# Patient Record
Sex: Male | Born: 1970 | Race: Black or African American | Hispanic: No | Marital: Married | Smoking: Current every day smoker
Health system: Southern US, Community
[De-identification: ages and names within clinical notes are randomized; demographics above are authoritative.]

## PROBLEM LIST (undated history)

## (undated) DIAGNOSIS — J45909 Unspecified asthma, uncomplicated: Secondary | ICD-10-CM

---

## 2005-02-09 ENCOUNTER — Emergency Department: Payer: Self-pay | Admitting: Unknown Physician Specialty

## 2005-08-17 ENCOUNTER — Other Ambulatory Visit: Payer: Self-pay

## 2005-08-17 ENCOUNTER — Emergency Department: Payer: Self-pay | Admitting: Emergency Medicine

## 2005-08-19 ENCOUNTER — Ambulatory Visit: Payer: Self-pay | Admitting: Emergency Medicine

## 2005-08-21 ENCOUNTER — Emergency Department: Payer: Self-pay | Admitting: Emergency Medicine

## 2005-08-25 ENCOUNTER — Ambulatory Visit: Payer: Self-pay | Admitting: Emergency Medicine

## 2006-09-12 ENCOUNTER — Emergency Department: Payer: Self-pay | Admitting: Emergency Medicine

## 2008-08-16 ENCOUNTER — Emergency Department: Payer: Self-pay | Admitting: Emergency Medicine

## 2008-08-17 ENCOUNTER — Emergency Department: Payer: Self-pay | Admitting: Emergency Medicine

## 2008-08-21 ENCOUNTER — Emergency Department: Payer: Self-pay | Admitting: Emergency Medicine

## 2008-09-11 ENCOUNTER — Ambulatory Visit: Payer: Self-pay | Admitting: Physician Assistant

## 2008-09-17 ENCOUNTER — Ambulatory Visit: Payer: Self-pay | Admitting: Unknown Physician Specialty

## 2011-03-21 ENCOUNTER — Emergency Department: Payer: Self-pay | Admitting: Emergency Medicine

## 2011-09-14 ENCOUNTER — Emergency Department: Payer: Self-pay | Admitting: Emergency Medicine

## 2015-07-09 ENCOUNTER — Emergency Department
Admission: EM | Admit: 2015-07-09 | Discharge: 2015-07-09 | Disposition: A | Discharge status: Home / Self Care | Payer: Worker's Compensation | Attending: Emergency Medicine | Admitting: Emergency Medicine

## 2015-07-09 ENCOUNTER — Emergency Department: Payer: Worker's Compensation

## 2015-07-09 DIAGNOSIS — S61215A Laceration without foreign body of left ring finger without damage to nail, initial encounter: Secondary | ICD-10-CM | POA: Diagnosis not present

## 2015-07-09 DIAGNOSIS — J45909 Unspecified asthma, uncomplicated: Secondary | ICD-10-CM | POA: Diagnosis not present

## 2015-07-09 DIAGNOSIS — Y92008 Other place in unspecified non-institutional (private) residence as the place of occurrence of the external cause: Secondary | ICD-10-CM | POA: Insufficient documentation

## 2015-07-09 DIAGNOSIS — S6982XA Other specified injuries of left wrist, hand and finger(s), initial encounter: Secondary | ICD-10-CM | POA: Diagnosis present

## 2015-07-09 DIAGNOSIS — Y9389 Activity, other specified: Secondary | ICD-10-CM | POA: Diagnosis not present

## 2015-07-09 DIAGNOSIS — F17200 Nicotine dependence, unspecified, uncomplicated: Secondary | ICD-10-CM | POA: Diagnosis not present

## 2015-07-09 DIAGNOSIS — S63285A Dislocation of proximal interphalangeal joint of left ring finger, initial encounter: Secondary | ICD-10-CM | POA: Diagnosis not present

## 2015-07-09 DIAGNOSIS — Y999 Unspecified external cause status: Secondary | ICD-10-CM | POA: Diagnosis not present

## 2015-07-09 DIAGNOSIS — W228XXA Striking against or struck by other objects, initial encounter: Secondary | ICD-10-CM | POA: Insufficient documentation

## 2015-07-09 DIAGNOSIS — S63255A Unspecified dislocation of left ring finger, initial encounter: Secondary | ICD-10-CM

## 2015-07-09 HISTORY — DX: Unspecified asthma, uncomplicated: J45.909

## 2015-07-09 MED ORDER — IBUPROFEN 600 MG PO TABS: 600.0000 mg | ORAL_TABLET | Freq: Four times a day (QID) | ORAL | Status: AC | PRN

## 2015-07-09 MED ORDER — HYDROCODONE-ACETAMINOPHEN 5-325 MG PO TABS: 1.0000 | ORAL_TABLET | Freq: Four times a day (QID) | ORAL | Status: AC | PRN

## 2015-07-09 MED ORDER — LIDOCAINE HCL (PF) 1 % IJ SOLN
5.0000 mL | Freq: Once | INTRAMUSCULAR | Status: DC
  Filled 2015-07-09: qty 5

## 2015-07-09 NOTE — Discharge Instructions (Signed)
Finger Dislocation Finger dislocation is the displacement of bones in your finger at the joints. Most commonly, finger dislocation occurs at the proximal interphalangeal joint (the joint closest to your knuckle). Very strong, fibrous tissues (ligaments) and joint capsules connect the three bones of your fingers.  CAUSES Dislocation is caused by a forceful impact. This impact moves these bones off the joint and often tears your ligaments.  SYMPTOMS Symptoms of finger dislocation include:  Deformity of your finger.  Pain, with loss of movement. DIAGNOSIS  Finger dislocation is diagnosed with a physical exam. Often, X-ray exams are done to see if you have associated injuries, such as bone fractures. TREATMENT  Finger dislocations are treated by putting your bones back into position (reduction) either by manually moving the bones back into place or through surgery. Your finger is then kept in a fixed position (immobilized) with the use of a dressing or splint for a brief period. When your ligament has to be surgically repaired, it needs to be kept in a fixed position with a dressing or splint for 1 to 2 weeks. Because joint stiffness is a long-term complication of finger dislocation, hand exercises or physical therapy to increase the range of motion and to regain strength is usually started as soon as the ligament is healed. Exercises and therapy generally last no more than 3 months. HOME CARE INSTRUCTIONS The following measures can help to reduce pain and speed up the healing process:  Rest your injured joint. Do not move until instructed otherwise by your caregiver. Avoid activities similar to the one that caused your injury.  Apply ice to your injured joint for the first day or 2 after your reduction or as directed by your caregiver. Applying ice helps to reduce inflammation and pain.  Put ice in a plastic bag.  Place a towel between your skin and the bag.  Leave the ice on for 15-20 minutes  at a time, every 2 hours while you are awake.  Elevate your hand above your heart as directed by your caregiver to reduce swelling.  Take over-the-counter or prescription medicine for pain as your caregiver instructs you. SEEK IMMEDIATE MEDICAL CARE IF:  Your dressing or splint becomes damaged.  Your pain becomes worse rather than better.  You lose feeling in your finger, or it becomes cold and white. MAKE SURE YOU:  Understand these instructions.  Will watch your condition.  Will get help right away if you are not doing well or get worse.   This information is not intended to replace advice given to you by your health care provider. Make sure you discuss any questions you have with your health care provider.   Document Released: 03/13/2000 Document Revised: 04/06/2014 Document Reviewed: 08/10/2014 Elsevier Interactive Patient Education 2016 [employer]  
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
## 2015-08-02 ENCOUNTER — Encounter: Payer: Self-pay | Admitting: *Deleted

## 2015-08-02 ENCOUNTER — Emergency Department: Payer: Self-pay

## 2015-08-02 ENCOUNTER — Emergency Department
Admission: EM | Admit: 2015-08-02 | Discharge: 2015-08-02 | Disposition: A | Discharge status: Home / Self Care | Payer: Self-pay | Attending: Emergency Medicine | Admitting: Emergency Medicine

## 2015-08-02 DIAGNOSIS — Z87891 Personal history of nicotine dependence: Secondary | ICD-10-CM | POA: Insufficient documentation

## 2015-08-02 DIAGNOSIS — J40 Bronchitis, not specified as acute or chronic: Secondary | ICD-10-CM

## 2015-08-02 DIAGNOSIS — Z91013 Allergy to seafood: Secondary | ICD-10-CM | POA: Insufficient documentation

## 2015-08-02 DIAGNOSIS — R0600 Dyspnea, unspecified: Secondary | ICD-10-CM

## 2015-08-02 DIAGNOSIS — Z79899 Other long term (current) drug therapy: Secondary | ICD-10-CM | POA: Insufficient documentation

## 2015-08-02 DIAGNOSIS — J209 Acute bronchitis, unspecified: Secondary | ICD-10-CM | POA: Insufficient documentation

## 2015-08-02 DIAGNOSIS — J45909 Unspecified asthma, uncomplicated: Secondary | ICD-10-CM

## 2015-08-02 LAB — COMPREHENSIVE METABOLIC PANEL
ALBUMIN: 4.5 g/dL (ref 3.5–5.0)
ALK PHOS: 48 U/L (ref 38–126)
ALT: 20 U/L (ref 17–63)
AST: 34 U/L (ref 15–41)
Anion gap: 10 (ref 5–15)
BILIRUBIN TOTAL: 0.5 mg/dL (ref 0.3–1.2)
BUN: 13 mg/dL (ref 6–20)
CALCIUM: 9.4 mg/dL (ref 8.9–10.3)
CO2: 24 mmol/L (ref 22–32)
Chloride: 105 mmol/L (ref 101–111)
Creatinine, Ser: 1 mg/dL (ref 0.61–1.24)
GFR calc Af Amer: >60 mL/min (ref >60)
GFR calc non Af Amer: >60 mL/min (ref >60)
GLUCOSE: 170 mg/dL — AB (ref 65–99)
POTASSIUM: 3.5 mmol/L (ref 3.5–5.1)
Sodium: 139 mmol/L (ref 135–145)
TOTAL PROTEIN: 8.1 g/dL (ref 6.5–8.1)

## 2015-08-02 LAB — CBC
HEMATOCRIT: 46.6 % (ref 40.0–52.0)
Hemoglobin: 15.8 g/dL (ref 13.0–18.0)
MCH: 30.8 pg (ref 26.0–34.0)
MCHC: 33.9 g/dL (ref 32.0–36.0)
MCV: 91 fL (ref 80.0–100.0)
Platelets: 230 10*3/uL (ref 150–440)
RBC: 5.12 MIL/uL (ref 4.40–5.90)
RDW: 12.6 % (ref 11.5–14.5)
WBC: 8.8 10*3/uL (ref 3.8–10.6)

## 2015-08-02 LAB — TROPONIN I: Troponin I: <0.03 ng/mL (ref <0.031)

## 2015-08-02 MED ORDER — IPRATROPIUM-ALBUTEROL 0.5-2.5 (3) MG/3ML IN SOLN
3.0000 mL | Freq: Once | RESPIRATORY_TRACT | Status: AC
  Administered 2015-08-02: 3 mL via RESPIRATORY_TRACT

## 2015-08-02 MED ORDER — BENZONATATE 100 MG PO CAPS
ORAL_CAPSULE | ORAL | Status: AC
  Filled 2015-08-02: qty 1

## 2015-08-02 MED ORDER — BENZONATATE 100 MG PO CAPS
100.0000 mg | ORAL_CAPSULE | Freq: Once | ORAL | Status: AC
  Administered 2015-08-02: 100 mg via ORAL
  Filled 2015-08-02: qty 1

## 2015-08-02 MED ORDER — IPRATROPIUM-ALBUTEROL 0.5-2.5 (3) MG/3ML IN SOLN
RESPIRATORY_TRACT | Status: AC
  Filled 2015-08-02: qty 3

## 2015-08-02 MED ORDER — PREDNISONE 20 MG PO TABS
60.0000 mg | ORAL_TABLET | Freq: Once | ORAL | Status: AC
  Administered 2015-08-02: 60 mg via ORAL
  Filled 2015-08-02: qty 3

## 2015-08-02 MED ORDER — AZITHROMYCIN 250 MG PO TABS: 250.0000 mg | ORAL_TABLET | Freq: Every day | ORAL | Status: AC

## 2015-08-02 MED ORDER — AZITHROMYCIN 500 MG PO TABS
500.0000 mg | ORAL_TABLET | Freq: Once | ORAL | Status: AC
  Administered 2015-08-02: 500 mg via ORAL
  Filled 2015-08-02: qty 1

## 2015-08-02 MED ORDER — ALBUTEROL SULFATE HFA 108 (90 BASE) MCG/ACT IN AERS: 2.0000 | INHALATION_SPRAY | Freq: Four times a day (QID) | RESPIRATORY_TRACT | Status: AC | PRN

## 2015-08-02 MED ORDER — PREDNISONE 20 MG PO TABS: 40.0000 mg | ORAL_TABLET | Freq: Every day | ORAL | Status: DC

## 2015-08-02 MED ORDER — IPRATROPIUM-ALBUTEROL 0.5-2.5 (3) MG/3ML IN SOLN
3.0000 mL | Freq: Once | RESPIRATORY_TRACT | Status: AC
  Administered 2015-08-02: 3 mL via RESPIRATORY_TRACT
  Filled 2015-08-02: qty 3

## 2015-08-02 NOTE — ED Provider Notes (Signed)
[facility] Emergency Department Provider Note  Time seen: 8:36 PM  I have reviewed the triage vital signs and the nursing notes.   HISTORY  Chief Complaint Shortness of Breath and Wheezing    HPI Jeffery MeigsHoward C Chrisp Jr. is a 45 y.o. male with a past medical history of asthma during childhood who presents the emergency department with shortness of breath and wheeze. According to the patient for the past 3 days he has been feeling short of breath with a cough and sputum production area denies any chest pain. States he was diagnosed with asthma as a child but has not had issues with it during adulthood. Does have a 6-7 years smoking history but quit 4 months ago. Denies any COPD diagnoses. Overall the patient appears well, he has received 2 breathing treatments in triage, and during my examination and history patient states he already feels much much better.Describes the shortness of breath is moderate when he was at home.     Past Medical History  Diagnosis Date  . Asthma     There are no active problems to display for this patient.   No past surgical history on file.  Current Outpatient Rx  Name  Route  Sig  Dispense  Refill  . HYDROcodone-acetaminophen (NORCO) 5-325 MG tablet   Oral   Take 1 tablet by mouth every 6 (six) hours as needed for severe pain.   6 tablet   0   . ibuprofen (ADVIL,MOTRIN) 600 MG tablet   Oral   Take 1 tablet (600 mg total) by mouth every 6 (six) hours as needed.   30 tablet   0     Allergies Shellfish allergy  No family history on file.  Social History Social History  Substance Use Topics  . Smoking status: Former Games developermoker  . Smokeless tobacco: None  . Alcohol Use: Yes    Review of Systems Constitutional: Negative for fever. Cardiovascular: Negative for chest pain. Respiratory:Moderate shortness of breath, much improved after breathing treatments. Gastrointestinal: Negative for abdominal pain Musculoskeletal:  Negative for back pain. Neurological: Negative for headache 10-point ROS otherwise negative.  ____________________________________________   PHYSICAL EXAM:  VITAL SIGNS: ED Triage Vitals  Enc Vitals Group     BP 08/02/15 1934 152/79 mmHg     Pulse Rate 08/02/15 1934 113     Resp 08/02/15 1934 22     Temp 08/02/15 1934 98.3 F (36.8 C)     Temp Source 08/02/15 1934 Oral     SpO2 08/02/15 1934 95 %     Weight 08/02/15 1934 260 lb (117.935 kg)     Height 08/02/15 1934 6\' 1"  (1.854 m)     Head Cir --      Peak Flow --      Pain Score 08/02/15 2030 0     Pain Loc --      Pain Edu? --      Excl. in GC? --     Constitutional: Alert and oriented. Well appearing and in no distress. Eyes: Normal exam ENT   Head: Normocephalic and atraumatic.   Mouth/Throat: Mucous membranes are moist. Cardiovascular: Normal rate, regular rhythm. No murmur Respiratory: Normal respiratory rate around 20 breaths per minute minute. No retractions. Mild to moderate expiratory wheezes bilaterally. Gastrointestinal: Soft and nontender. No distention.   Musculoskeletal: Nontender with normal range of motion in all extremities.  Neurologic:  Normal speech and language. No gross focal neurologic deficits Skin:  Skin is warm, dry and  intact.  Psychiatric: Mood and affect are normal. Speech and behavior are normal.  ____________________________________________    EKG  EKG reviewed and interpreted by myself shows sinus tachycardia 106 bpm, narrow QRS, normal axis, normal intervals, nonspecific but no concerning ST changes.  ____________________________________________    RADIOLOGY  Chest x-ray shows no acute abnormality   INITIAL IMPRESSION / ASSESSMENT AND PLAN / ED COURSE  Pertinent labs & imaging results that were available during my care of the patient were reviewed by me and considered in my medical decision making (see chart for details).  Patient presents the emergency department  with shortness of breath and 3 days of cough/congestion. Patient does have mild to moderate expiratory wheezes bilaterally. We'll dose of additional breathing treatment. Patient's chest x-ray is clear, currently awaiting lab results. Suspect the patient likely has an underlying reactive airway disease versus mild COPD which has been exacerbated by an upper respiratory infection/bronchitis. We'll cover the patient with a Z-Pak, started on prednisone, and dose an additional DuoNeb. Patient agreeable to plan.  Patient's labs are largely within normal limits. Patient is feeling much better. We'll discharge the patient home with primary care follow-up. We will cover with Zithromax, prednisone, and an albuterol inhaler. Patient agreeable to plan.  ____________________________________________   FINAL CLINICAL IMPRESSION(S) / ED DIAGNOSES  Dyspnea Reactive airway disease Bronchitis   [name], MD 08/02/15 2152

## 2015-08-02 NOTE — ED Notes (Signed)
Pt to triage via wheelchair.  Pt reports wheezing, tightness in chest for 3 days.  No n/v/d.  Sx worse tonight.  Hx of childhood asthma  Pt on no inhalers.   Pt wheezing in triage.

## 2015-08-02 NOTE — ED Notes (Signed)
Breathing treatment given in triage.  

## 2015-08-02 NOTE — Discharge Instructions (Signed)

## 2017-02-21 ENCOUNTER — Other Ambulatory Visit: Payer: Self-pay

## 2017-02-21 ENCOUNTER — Emergency Department: Payer: BLUE CROSS/BLUE SHIELD

## 2017-02-21 ENCOUNTER — Emergency Department
Admission: EM | Admit: 2017-02-21 | Discharge: 2017-02-21 | Disposition: A | Discharge status: Home / Self Care | Payer: BLUE CROSS/BLUE SHIELD | Attending: Emergency Medicine | Admitting: Emergency Medicine

## 2017-02-21 DIAGNOSIS — J40 Bronchitis, not specified as acute or chronic: Secondary | ICD-10-CM

## 2017-02-21 DIAGNOSIS — Z87891 Personal history of nicotine dependence: Secondary | ICD-10-CM | POA: Insufficient documentation

## 2017-02-21 DIAGNOSIS — R0602 Shortness of breath: Secondary | ICD-10-CM | POA: Diagnosis present

## 2017-02-21 LAB — CBC
HEMATOCRIT: 45.3 % (ref 40.0–52.0)
Hemoglobin: 15.6 g/dL (ref 13.0–18.0)
MCH: 31.5 pg (ref 26.0–34.0)
MCHC: 34.4 g/dL (ref 32.0–36.0)
MCV: 91.5 fL (ref 80.0–100.0)
Platelets: 216 10*3/uL (ref 150–440)
RBC: 4.95 MIL/uL (ref 4.40–5.90)
RDW: 12.7 % (ref 11.5–14.5)
WBC: 5.1 10*3/uL (ref 3.8–10.6)

## 2017-02-21 LAB — BASIC METABOLIC PANEL
Anion gap: 10 (ref 5–15)
BUN: 11 mg/dL (ref 6–20)
CHLORIDE: 105 mmol/L (ref 101–111)
CO2: 23 mmol/L (ref 22–32)
Calcium: 8.8 mg/dL — ABNORMAL LOW (ref 8.9–10.3)
Creatinine, Ser: 1.2 mg/dL (ref 0.61–1.24)
GFR calc non Af Amer: >60 mL/min (ref >60)
Glucose, Bld: 122 mg/dL — ABNORMAL HIGH (ref 65–99)
POTASSIUM: 3.6 mmol/L (ref 3.5–5.1)
SODIUM: 138 mmol/L (ref 135–145)

## 2017-02-21 LAB — TROPONIN I: Troponin I: <0.03 ng/mL (ref <0.03)

## 2017-02-21 MED ORDER — ALBUTEROL SULFATE HFA 108 (90 BASE) MCG/ACT IN AERS: 2.0000 | INHALATION_SPRAY | Freq: Four times a day (QID) | RESPIRATORY_TRACT | 2 refills | Status: AC | PRN

## 2017-02-21 MED ORDER — PREDNISONE 20 MG PO TABS
60.0000 mg | ORAL_TABLET | Freq: Once | ORAL | Status: AC
  Administered 2017-02-21: 60 mg via ORAL
  Filled 2017-02-21: qty 3

## 2017-02-21 MED ORDER — PREDNISONE 10 MG (21) PO TBPK: ORAL_TABLET | Freq: Every day | ORAL | 0 refills | Status: DC

## 2017-02-21 MED ORDER — IPRATROPIUM-ALBUTEROL 0.5-2.5 (3) MG/3ML IN SOLN
3.0000 mL | Freq: Once | RESPIRATORY_TRACT | Status: AC
  Administered 2017-02-21: 3 mL via RESPIRATORY_TRACT
  Filled 2017-02-21: qty 3

## 2017-02-21 MED ORDER — PREDNISONE 20 MG PO TABS
ORAL_TABLET | ORAL | Status: AC
  Filled 2017-02-21: qty 1

## 2017-02-21 NOTE — ED Triage Notes (Signed)
Pt came to ED via pov c/o sob starting yesterday. Productive cough. Denies fevers or chest pain. VS stable.

## 2017-02-21 NOTE — ED Notes (Signed)
Pt is ambulatory with wife to POV. VSS. Skin WNL. Respirations non-labored and equal. No questions or concerns during discharge instructions.

## 2017-02-21 NOTE — ED Provider Notes (Signed)
[facility] Emergency Department Provider Note       Time seen: ----------------------------------------- 7:39 PM on 02/21/2017 -----------------------------------------   I have reviewed the triage vital signs and the nursing notes.  HISTORY   Chief Complaint Shortness of Breath    HPI Jeffery MeigsHoward C Chrisp Jr. is a 46 y.o. male with no known past medical history who presents to the ED for shortness of breath starting yesterday.  He describes a productive cough but denies any fevers or chest pain.  Shortness of breath is worse with exertion.  Patient does admit to smoking and states he likely gets respiratory infections twice a year.  Last year he received breathing treatments due to shortness of breath associated with a respiratory infection.  He has not had any respiratory infection since then.  Past Medical History:  Diagnosis Date  . Asthma     There are no active problems to display for this patient.   History reviewed. No pertinent surgical history.  Allergies Shellfish allergy  Social History Social History   Tobacco Use  . Smoking status: Former Smoker  Substance Use Topics  . Alcohol use: Yes  . Drug use: Not on file    Review of Systems Constitutional: Negative for fever. Eyes: Negative for vision changes ENT:  Negative for congestion, sore throat Cardiovascular: Negative for chest pain. Respiratory: Positive for cough and shortness of breath Gastrointestinal: Negative for abdominal pain, vomiting and diarrhea. Genitourinary: Negative for dysuria. Musculoskeletal: Negative for back pain. Skin: Negative for rash. Neurological: Negative for headaches, focal weakness or numbness.  All systems negative/normal/unremarkable except as stated in the HPI  ____________________________________________   PHYSICAL EXAM:  VITAL SIGNS: ED Triage Vitals [02/21/17 1854]  Enc Vitals Group     BP (!) 149/84     Pulse Rate 83     Resp 16      Temp 98.6 F (37 C)     Temp Source Oral     SpO2 98 %     Weight 235 lb (106.6 kg)     Height 6\' 1"  (1.854 m)     Head Circumference      Peak Flow      Pain Score      Pain Loc      Pain Edu?      Excl. in GC?     Constitutional: Alert and oriented. Well appearing and in no distress. Eyes: Conjunctivae are normal. Normal extraocular movements. ENT   Head: Normocephalic and atraumatic.   Nose: No congestion/rhinnorhea.   Mouth/Throat: Mucous membranes are moist.   Neck: No stridor. Cardiovascular: Normal rate, regular rhythm. No murmurs, rubs, or gallops. Respiratory: Normal respiratory effort without tachypnea nor retractions. Breath sounds are clear and equal bilaterally. No wheezes/rales/rhonchi. Gastrointestinal: Soft and nontender. Normal bowel sounds Musculoskeletal: Nontender with normal range of motion in extremities. No lower extremity tenderness nor edema. Neurologic:  Normal speech and language. No gross focal neurologic deficits are appreciated.  Skin:  Skin is warm, dry and intact. No rash noted. Psychiatric: Mood and affect are normal. Speech and behavior are normal.  ____________________________________________  EKG: Interpreted by me.  Sinus rhythm with a rate of 70 bpm, normal PR interval, normal QRS, normal QT.  ____________________________________________  ED COURSE:  Pertinent labs & imaging results that were available during my care of the patient were reviewed by me and considered in my medical decision making (see chart for details). Patient presents for shortness of breath and cough, we will assess  with labs and imaging as indicated.   Procedures ____________________________________________   LABS (pertinent positives/negatives)  Labs Reviewed  BASIC METABOLIC PANEL - Abnormal; Notable for the following components:      Result Value   Glucose, Bld 122 (*)    Calcium 8.8 (*)    All other components within normal limits  CBC   TROPONIN I    RADIOLOGY Images were viewed by me  Chest x-ray IMPRESSION: No active cardiopulmonary disease. ____________________________________________  DIFFERENTIAL DIAGNOSIS   Pneumonia, URI, bronchitis, COPD, asthma, pneumothorax, CHF  FINAL ASSESSMENT AND PLAN  Acute bronchitis   Plan: Patient had presented for shortness of breath. Patient's labs are reassuring. Patient's imaging did not reveal any pneumonia.  This is likely bronchitis and he does not warrant antibiotics at this time but would benefit from inhalers and steroids.  He is stable for outpatient follow-up.   Emily FilbertWilliams, Jonathan E, MD   Note: This note was generated in part or whole with voice recognition software. Voice recognition is usually quite accurate but there are transcription errors that can and very often do occur. I apologize for any typographical errors that were not detected and corrected.     Emily FilbertWilliams, Jonathan E, MD 02/21/17 2037

## 2017-06-28 ENCOUNTER — Other Ambulatory Visit: Payer: Self-pay

## 2017-06-28 ENCOUNTER — Encounter: Payer: Self-pay | Admitting: Emergency Medicine

## 2017-06-28 DIAGNOSIS — Z79899 Other long term (current) drug therapy: Secondary | ICD-10-CM | POA: Insufficient documentation

## 2017-06-28 DIAGNOSIS — R1010 Upper abdominal pain, unspecified: Secondary | ICD-10-CM | POA: Insufficient documentation

## 2017-06-28 DIAGNOSIS — Z87891 Personal history of nicotine dependence: Secondary | ICD-10-CM | POA: Insufficient documentation

## 2017-06-28 DIAGNOSIS — J45909 Unspecified asthma, uncomplicated: Secondary | ICD-10-CM | POA: Insufficient documentation

## 2017-06-28 LAB — COMPREHENSIVE METABOLIC PANEL
ALBUMIN: 4.3 g/dL (ref 3.5–5.0)
ALT: 21 U/L (ref 17–63)
ANION GAP: 10 (ref 5–15)
AST: 37 U/L (ref 15–41)
Alkaline Phosphatase: 73 U/L (ref 38–126)
BUN: 16 mg/dL (ref 6–20)
CHLORIDE: 106 mmol/L (ref 101–111)
CO2: 23 mmol/L (ref 22–32)
Calcium: 9.4 mg/dL (ref 8.9–10.3)
Creatinine, Ser: 0.95 mg/dL (ref 0.61–1.24)
GFR calc non Af Amer: >60 mL/min (ref >60)
Glucose, Bld: 145 mg/dL — ABNORMAL HIGH (ref 65–99)
POTASSIUM: 3.7 mmol/L (ref 3.5–5.1)
SODIUM: 139 mmol/L (ref 135–145)
Total Bilirubin: 0.5 mg/dL (ref 0.3–1.2)
Total Protein: 8 g/dL (ref 6.5–8.1)

## 2017-06-28 LAB — CBC
HEMATOCRIT: 51.3 % (ref 40.0–52.0)
Hemoglobin: 17.1 g/dL (ref 13.0–18.0)
MCH: 31 pg (ref 26.0–34.0)
MCHC: 33.4 g/dL (ref 32.0–36.0)
MCV: 92.9 fL (ref 80.0–100.0)
Platelets: 252 10*3/uL (ref 150–440)
RBC: 5.52 MIL/uL (ref 4.40–5.90)
RDW: 12.9 % (ref 11.5–14.5)
WBC: 8.8 10*3/uL (ref 3.8–10.6)

## 2017-06-28 LAB — LIPASE, BLOOD: LIPASE: 34 U/L (ref 11–51)

## 2017-06-28 NOTE — ED Triage Notes (Signed)
Pt reports that he has had RUQ and LUQ pain for months states that it comes and goes. Denies any N/V/D. States that it is worse with movement.

## 2017-06-29 ENCOUNTER — Emergency Department
Admission: EM | Admit: 2017-06-29 | Discharge: 2017-06-29 | Disposition: A | Discharge status: Home / Self Care | Payer: Self-pay | Attending: Emergency Medicine | Admitting: Emergency Medicine

## 2017-06-29 ENCOUNTER — Encounter: Payer: Self-pay | Admitting: Radiology

## 2017-06-29 ENCOUNTER — Emergency Department: Payer: Self-pay

## 2017-06-29 DIAGNOSIS — R1010 Upper abdominal pain, unspecified: Secondary | ICD-10-CM

## 2017-06-29 LAB — URINALYSIS, COMPLETE (UACMP) WITH MICROSCOPIC
BACTERIA UA: NONE SEEN
Bilirubin Urine: NEGATIVE
Glucose, UA: NEGATIVE mg/dL
KETONES UR: NEGATIVE mg/dL
LEUKOCYTES UA: NEGATIVE
NITRITE: NEGATIVE
PROTEIN: NEGATIVE mg/dL
SQUAMOUS EPITHELIAL / LPF: NONE SEEN
Specific Gravity, Urine: 1.027 (ref 1.005–1.030)
pH: 5 (ref 5.0–8.0)

## 2017-06-29 MED ORDER — IOPAMIDOL (ISOVUE-300) INJECTION 61%
125.0000 mL | Freq: Once | INTRAVENOUS | Status: AC | PRN
  Administered 2017-06-29: 125 mL via INTRAVENOUS

## 2017-06-29 MED ORDER — SUCRALFATE 1 G PO TABS: 1.0000 g | ORAL_TABLET | Freq: Two times a day (BID) | ORAL | 0 refills | Status: AC

## 2017-06-29 MED ORDER — SUCRALFATE 1 G PO TABS
1.0000 g | ORAL_TABLET | Freq: Once | ORAL | Status: AC
  Administered 2017-06-29: 1 g via ORAL
  Filled 2017-06-29: qty 1

## 2017-06-29 MED ORDER — GI COCKTAIL ~~LOC~~
30.0000 mL | Freq: Once | ORAL | Status: AC
  Administered 2017-06-29: 30 mL via ORAL
  Filled 2017-06-29: qty 30

## 2017-06-29 MED ORDER — FAMOTIDINE 40 MG PO TABS: 40.0000 mg | ORAL_TABLET | Freq: Every evening | ORAL | 0 refills | Status: AC

## 2017-06-29 NOTE — Discharge Instructions (Addendum)
Please follow-up with GI for further evaluation of this upper abdominal pain.

## 2017-06-29 NOTE — ED Notes (Signed)
Pt states that he has pain on both sides but primarily the left. A "stabbing" feeling "off and on" for months, but today the pain didn't go away. Wife at bedside.

## 2017-06-29 NOTE — ED Provider Notes (Signed)
[facility] Emergency Department Provider Note   ____________________________________________   First MD Initiated Contact with Patient 06/29/17 0127     (approximate)  I have reviewed the triage vital signs and the nursing notes.   HISTORY  Chief Complaint Abdominal Pain    HPI Jeffery Huff. is a 47 y.o. male who comes into the hospital today with some abdominal pain.  He reports it has been  happening on and off for months.  He states that it comes and goes.  Today the pain came and it stayed.  The patient was not doing anything but watching television.  He did not take anything for pain at home.  The patient denies any nausea or vomiting.  The patient had some grilled chicken and quesadilla while he was here in the emergency department states that he had not eaten before the pain started.  The patient rates his pain a 5 out of 10 in intensity.  It is in his bilateral upper abdominal area.  The patient has not seen GI or primary care physician.  The patient denies any shortness of breath chest pain or fevers.  He is here today for evaluation.  The patient went to the walk-in clinic and was sent here for evaluation.   Past Medical History:  Diagnosis Date  . Asthma     There are no active problems to display for this patient.   History reviewed. No pertinent surgical history.  Prior to Admission medications   Medication Sig Start Date End Date Taking? Authorizing Provider  albuterol (PROVENTIL HFA;VENTOLIN HFA) 108 (90 Base) MCG/ACT inhaler Inhale 2 puffs into the lungs every 6 (six) hours as needed for wheezing or shortness of breath. 08/02/15   [name], MD  albuterol (PROVENTIL HFA;VENTOLIN HFA) 108 (90 Base) MCG/ACT inhaler Inhale 2 puffs into the lungs every 6 (six) hours as needed for wheezing or shortness of breath. 02/21/17   Emily Filbert, MD  azithromycin (ZITHROMAX) 250 MG tablet Take 1 tablet (250 mg total) by mouth daily.  08/02/15   [name], MD  famotidine (PEPCID) 40 MG tablet Take 1 tablet (40 mg total) by mouth every evening. 06/29/17 06/29/18  Rebecka Apley, MD  HYDROcodone-acetaminophen (NORCO) 5-325 MG tablet Take 1 tablet by mouth every 6 (six) hours as needed for severe pain. 07/09/15   Hagler, Jami L, PA-C  ibuprofen (ADVIL,MOTRIN) 600 MG tablet Take 1 tablet (600 mg total) by mouth every 6 (six) hours as needed. 07/09/15   Hagler, Jami L, PA-C  predniSONE (DELTASONE) 20 MG tablet Take 2 tablets (40 mg total) by mouth daily. 08/02/15   [name], MD  predniSONE (STERAPRED UNI-PAK 21 TAB) 10 MG (21) TBPK tablet Take by mouth daily. Dispense steroid taper pack as directed 02/21/17   Emily Filbert, MD  sucralfate (CARAFATE) 1 g tablet Take 1 tablet (1 g total) by mouth 2 (two) times daily. 06/29/17   Rebecka Apley, MD    Allergies Shellfish allergy  History reviewed. No pertinent family history.  Social History Social History   Tobacco Use  . Smoking status: Former Smoker  Substance Use Topics  . Alcohol use: Yes  . Drug use: Not on file    Review of Systems  Constitutional: No fever/chills Eyes: No visual changes. ENT: No sore throat. Cardiovascular: Denies chest pain. Respiratory: Denies shortness of breath. Gastrointestinal:  abdominal pain.  No nausea, no vomiting.  No diarrhea.  No constipation. Genitourinary: Negative for  dysuria. Musculoskeletal: Negative for back pain. Skin: Negative for rash. Neurological: Negative for headaches, focal weakness or numbness.   ____________________________________________   PHYSICAL EXAM:  VITAL SIGNS: ED Triage Vitals  Enc Vitals Group     BP 06/28/17 1945 97/63     Pulse Rate 06/28/17 1945 91     Resp 06/28/17 1945 20     Temp 06/28/17 1945 98.4 F (36.9 C)     Temp Source 06/28/17 1945 Oral     SpO2 06/28/17 1945 96 %     Weight 06/28/17 1946 269 lb 9.6 oz (122.3 kg)     Height 06/28/17 1946 6\' 1"  (1.854 m)      Head Circumference --      Peak Flow --      Pain Score 06/28/17 1946 7     Pain Loc --      Pain Edu? --      Excl. in GC? --     Constitutional: Alert and oriented. Well appearing and in moderate distress. Eyes: Conjunctivae are normal. PERRL. EOMI. Head: Atraumatic. Nose: No congestion/rhinnorhea. Mouth/Throat: Mucous membranes are moist.  Oropharynx non-erythematous. Cardiovascular: Normal rate, regular rhythm. Grossly normal heart sounds.  Good peripheral circulation. Respiratory: Normal respiratory effort.  No retractions. Lungs CTAB. Gastrointestinal: Soft with some bilateral upper abdominal pain to palpation left greater than right . No distention.  Positive bowel sounds Musculoskeletal: No lower extremity tenderness nor edema.   Neurologic:  Normal speech and language.  Skin:  Skin is warm, dry and intact.  Psychiatric: Mood and affect are normal.   ____________________________________________   LABS (all labs ordered are listed, but only abnormal results are displayed)  Labs Reviewed  COMPREHENSIVE METABOLIC PANEL - Abnormal; Notable for the following components:      Result Value   Glucose, Bld 145 (*)    All other components within normal limits  URINALYSIS, COMPLETE (UACMP) WITH MICROSCOPIC - Abnormal; Notable for the following components:   Color, Urine YELLOW (*)    APPearance CLEAR (*)    Hgb urine dipstick SMALL (*)    All other components within normal limits  LIPASE, BLOOD  CBC   ____________________________________________  EKG  none ____________________________________________  RADIOLOGY  ED MD interpretation:  CT abd and pelvis: No acute intra-abdominal or pelvic process, very small hiatal hernia.  Official radiology report(s): Ct Abdomen Pelvis W Contrast  Result Date: 06/29/2017 CLINICAL DATA:  Intermittent upper abdominal pain for a few months. EXAM: CT ABDOMEN AND PELVIS WITH CONTRAST TECHNIQUE: Multidetector CT imaging of the abdomen  and pelvis was performed using the standard protocol following bolus administration of intravenous contrast. CONTRAST:  125mL ISOVUE-300 IOPAMIDOL (ISOVUE-300) INJECTION 61% COMPARISON:  CT pelvis September 17, 2008 FINDINGS: LOWER CHEST: Lung bases are clear. Included heart size is normal. No pericardial effusion. HEPATOBILIARY: 16 mm cyst segment for the liver. Mildly contracted gallbladder, otherwise unremarkable. PANCREAS: Normal. SPLEEN: Normal. ADRENALS/URINARY TRACT: Kidneys are orthotopic, demonstrating symmetric enhancement. No nephrolithiasis, hydronephrosis or solid renal masses. The unopacified ureters are normal in course and caliber. Delayed imaging through the kidneys demonstrates symmetric prompt contrast excretion within the proximal urinary collecting system. Urinary bladder is partially distended and unremarkable. Normal adrenal glands. STOMACH/BOWEL: Very small hiatal hernia. The stomach, small and large bowel are normal in course and caliber without inflammatory changes. Normal appendix. VASCULAR/LYMPHATIC: Aortoiliac vessels are normal in course and caliber. No lymphadenopathy by CT size criteria. REPRODUCTIVE: Normal. OTHER: No intraperitoneal free fluid or free air. MUSCULOSKELETAL: Nonacute. Small fat  containing umbilical hernia. Small fat containing inguinal hernias. Posttraumatic ankylosis RIGHT sacroiliac joint. IMPRESSION: 1. No acute intra-abdominal or pelvic process. 2. Very small hiatal hernia. Electronically Signed   By: Awilda Metro M.D.   On: 06/29/2017 05:28    ____________________________________________   PROCEDURES  Procedure(s) performed: None  Procedures  Critical Care performed: No  ____________________________________________   INITIAL IMPRESSION / ASSESSMENT AND PLAN / ED COURSE  As part of my medical decision making, I reviewed the following data within the electronic MEDICAL RECORD NUMBER Notes from prior ED visits and [place] Controlled Substance  Database   This is a 47 year old male who comes into the hospital today with some abdominal pain.  The patient has been having this pain intermittently for months.    My differential diagnosis includes gastritis, reflux, ulcer, Cholelithiasis, pancreatitis.    The patient did have a CBC CMP urinalysis and lipase performed which is unremarkable.  I did send the patient for a CT scan of his abdomen and pelvis which was negative.  I did give the patient a GI cocktail and some Carafate.  I feel that the patient needs to follow-up with GI for an endoscopy looking for possible ulcers causing his pain.  The patient is feeling well at this time.  He was able to eat without any vomiting.  He will be discharged home to follow-up with GI and the acute care clinic.      ____________________________________________   FINAL CLINICAL IMPRESSION(S) / ED DIAGNOSES  Final diagnoses:  Pain of upper abdomen     ED Discharge Orders        Ordered    sucralfate (CARAFATE) 1 g tablet  2 times daily     06/29/17 0543    famotidine (PEPCID) 40 MG tablet  Every evening     06/29/17 0543       Note:  This document was prepared using Dragon voice recognition software and may include unintentional dictation errors.    Rebecka Apley, MD 06/29/17 [phone]

## 2017-08-24 ENCOUNTER — Encounter: Payer: Self-pay | Admitting: Emergency Medicine

## 2017-08-24 ENCOUNTER — Other Ambulatory Visit: Payer: Self-pay

## 2017-08-24 ENCOUNTER — Emergency Department
Admission: EM | Admit: 2017-08-24 | Discharge: 2017-08-24 | Disposition: A | Discharge status: Home / Self Care | Payer: Self-pay | Attending: Emergency Medicine | Admitting: Emergency Medicine

## 2017-08-24 ENCOUNTER — Emergency Department: Payer: Self-pay

## 2017-08-24 DIAGNOSIS — Z87891 Personal history of nicotine dependence: Secondary | ICD-10-CM | POA: Insufficient documentation

## 2017-08-24 DIAGNOSIS — R202 Paresthesia of skin: Secondary | ICD-10-CM | POA: Insufficient documentation

## 2017-08-24 DIAGNOSIS — G510 Bell's palsy: Secondary | ICD-10-CM | POA: Insufficient documentation

## 2017-08-24 DIAGNOSIS — J45909 Unspecified asthma, uncomplicated: Secondary | ICD-10-CM | POA: Insufficient documentation

## 2017-08-24 DIAGNOSIS — Z79899 Other long term (current) drug therapy: Secondary | ICD-10-CM | POA: Insufficient documentation

## 2017-08-24 LAB — COMPREHENSIVE METABOLIC PANEL
ALK PHOS: 56 U/L (ref 38–126)
ALT: 17 U/L (ref 17–63)
AST: 24 U/L (ref 15–41)
Albumin: 4.3 g/dL (ref 3.5–5.0)
Anion gap: 12 (ref 5–15)
BILIRUBIN TOTAL: 0.6 mg/dL (ref 0.3–1.2)
BUN: 9 mg/dL (ref 6–20)
CALCIUM: 9.5 mg/dL (ref 8.9–10.3)
CO2: 22 mmol/L (ref 22–32)
CREATININE: 0.97 mg/dL (ref 0.61–1.24)
Chloride: 106 mmol/L (ref 101–111)
GFR calc Af Amer: >60 mL/min (ref >60)
Glucose, Bld: 99 mg/dL (ref 65–99)
Potassium: 3.6 mmol/L (ref 3.5–5.1)
Sodium: 140 mmol/L (ref 135–145)
Total Protein: 7.8 g/dL (ref 6.5–8.1)

## 2017-08-24 LAB — APTT: aPTT: 26 seconds (ref 24–36)

## 2017-08-24 LAB — CBC
HEMATOCRIT: 51 % (ref 40.0–52.0)
HEMOGLOBIN: 17.4 g/dL (ref 13.0–18.0)
MCH: 31.7 pg (ref 26.0–34.0)
MCHC: 34.2 g/dL (ref 32.0–36.0)
MCV: 92.7 fL (ref 80.0–100.0)
Platelets: 239 10*3/uL (ref 150–440)
RBC: 5.5 MIL/uL (ref 4.40–5.90)
RDW: 12.6 % (ref 11.5–14.5)
WBC: 6 10*3/uL (ref 3.8–10.6)

## 2017-08-24 LAB — DIFFERENTIAL
BASOS PCT: 1 %
Basophils Absolute: 0.1 10*3/uL (ref 0–0.1)
Eosinophils Absolute: 0.8 10*3/uL — ABNORMAL HIGH (ref 0–0.7)
Eosinophils Relative: 14 %
LYMPHS ABS: 1.4 10*3/uL (ref 1.0–3.6)
LYMPHS PCT: 24 %
MONO ABS: 0.5 10*3/uL (ref 0.2–1.0)
MONOS PCT: 8 %
NEUTROS ABS: 3.1 10*3/uL (ref 1.4–6.5)
Neutrophils Relative %: 53 %

## 2017-08-24 LAB — TROPONIN I

## 2017-08-24 LAB — PROTIME-INR
INR: 1.01
Prothrombin Time: 13.2 seconds (ref 11.4–15.2)

## 2017-08-24 MED ORDER — POLYVINYL ALCOHOL 1.4 % OP SOLN: 1.0000 [drp] | OPHTHALMIC | 0 refills | Status: AC | PRN

## 2017-08-24 MED ORDER — PREDNISONE 20 MG PO TABS
60.0000 mg | ORAL_TABLET | Freq: Once | ORAL | Status: AC
  Administered 2017-08-24: 60 mg via ORAL
  Filled 2017-08-24: qty 3

## 2017-08-24 MED ORDER — VALACYCLOVIR HCL 1 G PO TABS: 1000.0000 mg | ORAL_TABLET | Freq: Three times a day (TID) | ORAL | 0 refills | Status: AC

## 2017-08-24 MED ORDER — PREDNISONE 20 MG PO TABS: 60.0000 mg | ORAL_TABLET | Freq: Every day | ORAL | 0 refills | Status: AC

## 2017-08-24 NOTE — Discharge Instructions (Signed)
Take the medications as prescribed and finish the full course.  We have provided her primary care referral for you.  You should follow-up with the next few weeks.  You should patch the eye at night and use sunglasses when outdoors to help prevent injury to the eye.  Return to the ER for any new or worsening symptoms that concern you.

## 2017-08-24 NOTE — ED Provider Notes (Signed)
[facility] Emergency Department Provider Note ____________________________________________   First MD Initiated Contact with Patient 08/24/17 2214     (approximate)  I have reviewed the triage vital signs and the nursing notes.   HISTORY  Chief Complaint Facial Droop and Numbness    HPI Jeffery Huff. is a 47 y.o. male with PMH as noted below who presents with left facial droop, acute onset 3 days ago, associated with tingling but no numbness, not associated with any other numbness or weakness.  Patient reports irritation to his left eye because he has difficulty closing it.  No prior history of this symptom.  No recent illness other than some nasal congestion.  No sick contacts.  Past Medical History:  Diagnosis Date  . Asthma     There are no active problems to display for this patient.   History reviewed. No pertinent surgical history.  Prior to Admission medications   Medication Sig Start Date End Date Taking? Authorizing Provider  albuterol (PROVENTIL HFA;VENTOLIN HFA) 108 (90 Base) MCG/ACT inhaler Inhale 2 puffs into the lungs every 6 (six) hours as needed for wheezing or shortness of breath. 08/02/15   [name], MD  albuterol (PROVENTIL HFA;VENTOLIN HFA) 108 (90 Base) MCG/ACT inhaler Inhale 2 puffs into the lungs every 6 (six) hours as needed for wheezing or shortness of breath. 02/21/17   Emily Filbert, MD  azithromycin (ZITHROMAX) 250 MG tablet Take 1 tablet (250 mg total) by mouth daily. 08/02/15   [name], MD  famotidine (PEPCID) 40 MG tablet Take 1 tablet (40 mg total) by mouth every evening. 06/29/17 06/29/18  Rebecka Apley, MD  HYDROcodone-acetaminophen (NORCO) 5-325 MG tablet Take 1 tablet by mouth every 6 (six) hours as needed for severe pain. 07/09/15   Hagler, Jami L, PA-C  ibuprofen (ADVIL,MOTRIN) 600 MG tablet Take 1 tablet (600 mg total) by mouth every 6 (six) hours as needed. 07/09/15   Hagler, Jami L,  PA-C  predniSONE (DELTASONE) 20 MG tablet Take 2 tablets (40 mg total) by mouth daily. 08/02/15   [name], MD  predniSONE (STERAPRED UNI-PAK 21 TAB) 10 MG (21) TBPK tablet Take by mouth daily. Dispense steroid taper pack as directed 02/21/17   Emily Filbert, MD  sucralfate (CARAFATE) 1 g tablet Take 1 tablet (1 g total) by mouth 2 (two) times daily. 06/29/17   Rebecka Apley, MD    Allergies Shellfish allergy  History reviewed. No pertinent family history.  Social History Social History   Tobacco Use  . Smoking status: Former Games developer  . Smokeless tobacco: Never Used  Substance Use Topics  . Alcohol use: Yes  . Drug use: Not on file    Review of Systems  Constitutional: No fever. Eyes: No visual changes. ENT: No neck pain. Cardiovascular: Denies chest pain. Respiratory: Denies shortness of breath. Gastrointestinal: No vomiting. Genitourinary: Negative for flank pain.  Musculoskeletal: Negative for back pain. Skin: Negative for rash. Neurological: Negative for headache.   ____________________________________________   PHYSICAL EXAM:  VITAL SIGNS: ED Triage Vitals  Enc Vitals Group     BP 08/24/17 1819 (!) 142/92     Pulse Rate 08/24/17 1819 100     Resp 08/24/17 1819 18     Temp 08/24/17 1819 99 F (37.2 C)     Temp Source 08/24/17 1819 Oral     SpO2 08/24/17 1819 98 %     Weight 08/24/17 1819 270 lb (122.5 kg)  Height 08/24/17 1819  (1.854 m)     Head Circumference --      Peak Flow --      Pain Score 08/24/17 1818 0     Pain Loc --      Pain Edu? --      Excl. in GC? --     Constitutional: Alert and oriented. Well appearing and in no acute distress. Eyes: Conjunctivae are normal.  EOMI.  PERRLA. Head: Atraumatic. Nose: No congestion/rhinnorhea. Mouth/Throat: Mucous membranes are moist.   Neck: Normal range of motion.  Cardiovascular: Good peripheral circulation. Respiratory: Normal respiratory effort.  Gastrointestinal: No  distention.  Musculoskeletal:  Extremities warm and well perfused.  Neurologic: Left facial droop with forehead involvement.  Normal speech and language. No gross focal neurologic deficits are appreciated.  Skin: No rash noted. Psychiatric: Mood and affect are normal. Speech and behavior are normal.  ____________________________________________   LABS (all labs ordered are listed, but only abnormal results are displayed)  Labs Reviewed  DIFFERENTIAL - Abnormal; Notable for the following components:      Result Value   Eosinophils Absolute 0.8 (*)    All other components within normal limits  PROTIME-INR  APTT  CBC  COMPREHENSIVE METABOLIC PANEL  TROPONIN I   ____________________________________________  EKG   ____________________________________________  RADIOLOGY  CT head: No acute findings  ____________________________________________   PROCEDURES  Procedure(s) performed: No  Procedures  Critical Care performed: No ____________________________________________   INITIAL IMPRESSION / ASSESSMENT AND PLAN / ED COURSE  Pertinent labs & imaging results that were available during my care of the patient were reviewed by me and considered in my medical decision making (see chart for details).  47 year old male with history of asthma presents with left facial droop over the last 3 days.  No other associated neurologic symptoms.  On exam, vital signs are normal, the patient is well-appearing, and he has left facial droop with forehead involvement.  CT head was obtained from triage and is negative.  Patient's presentation is consistent with Bell's palsy.  There is no evidence of stroke or other CNS cause.  Plan: Steroid, antiviral, eye patch with gauze, and artificial tears.  Return precautions given, and the patient expresses understanding.   ____________________________________________   FINAL CLINICAL IMPRESSION(S) / ED DIAGNOSES  Final diagnoses:  Bell's  palsy      NEW MEDICATIONS STARTED DURING THIS VISIT:  New Prescriptions   No medications on file     Note:  This document was prepared using Dragon voice recognition software and may include unintentional dictation errors.    Dionne Bucy, MD 08/24/17 2302

## 2017-08-24 NOTE — ED Triage Notes (Signed)
Pt arrived via POV with reports of left side facial numbness since last night and facial droop this morning.  Pt unable to close left eye completely and unable to lift left eye brow.  Pt reports excess tearing to left eye and is more reddened than the right.

## 2017-08-24 NOTE — ED Notes (Signed)
Discussed with Dr. Marisa Severin, new order received for CT head without contrast.

## 2017-10-08 ENCOUNTER — Other Ambulatory Visit: Payer: Self-pay

## 2017-10-08 ENCOUNTER — Encounter: Payer: Self-pay | Admitting: Emergency Medicine

## 2017-10-08 ENCOUNTER — Emergency Department: Payer: Self-pay

## 2017-10-08 ENCOUNTER — Emergency Department
Admission: EM | Admit: 2017-10-08 | Discharge: 2017-10-08 | Disposition: A | Discharge status: Home / Self Care | Payer: Self-pay | Attending: Emergency Medicine | Admitting: Emergency Medicine

## 2017-10-08 DIAGNOSIS — J181 Lobar pneumonia, unspecified organism: Secondary | ICD-10-CM | POA: Insufficient documentation

## 2017-10-08 DIAGNOSIS — J45909 Unspecified asthma, uncomplicated: Secondary | ICD-10-CM | POA: Insufficient documentation

## 2017-10-08 DIAGNOSIS — Z79899 Other long term (current) drug therapy: Secondary | ICD-10-CM | POA: Insufficient documentation

## 2017-10-08 DIAGNOSIS — J189 Pneumonia, unspecified organism: Secondary | ICD-10-CM

## 2017-10-08 DIAGNOSIS — F17200 Nicotine dependence, unspecified, uncomplicated: Secondary | ICD-10-CM | POA: Insufficient documentation

## 2017-10-08 LAB — CBC
HCT: 44.9 % (ref 40.0–52.0)
Hemoglobin: 15.2 g/dL (ref 13.0–18.0)
MCH: 31.1 pg (ref 26.0–34.0)
MCHC: 33.9 g/dL (ref 32.0–36.0)
MCV: 91.5 fL (ref 80.0–100.0)
PLATELETS: 204 10*3/uL (ref 150–440)
RBC: 4.91 MIL/uL (ref 4.40–5.90)
RDW: 12.6 % (ref 11.5–14.5)
WBC: 14.2 10*3/uL — AB (ref 3.8–10.6)

## 2017-10-08 LAB — BASIC METABOLIC PANEL
Anion gap: 9 (ref 5–15)
BUN: 10 mg/dL (ref 6–20)
CO2: 26 mmol/L (ref 22–32)
CREATININE: 1.14 mg/dL (ref 0.61–1.24)
Calcium: 9.1 mg/dL (ref 8.9–10.3)
Chloride: 100 mmol/L (ref 98–111)
GFR calc Af Amer: >60 mL/min (ref >60)
Glucose, Bld: 129 mg/dL — ABNORMAL HIGH (ref 70–99)
Potassium: 3.7 mmol/L (ref 3.5–5.1)
SODIUM: 135 mmol/L (ref 135–145)

## 2017-10-08 LAB — FIBRIN DERIVATIVES D-DIMER (ARMC ONLY): FIBRIN DERIVATIVES D-DIMER (ARMC): 1147.6 ng{FEU}/mL — AB (ref 0.00–499.00)

## 2017-10-08 LAB — TROPONIN I

## 2017-10-08 MED ORDER — IOPAMIDOL (ISOVUE-370) INJECTION 76%
100.0000 mL | Freq: Once | INTRAVENOUS | Status: AC | PRN
  Administered 2017-10-08: 75 mL via INTRAVENOUS

## 2017-10-08 MED ORDER — KETOROLAC TROMETHAMINE 30 MG/ML IJ SOLN
30.0000 mg | Freq: Once | INTRAMUSCULAR | Status: AC
  Administered 2017-10-08: 30 mg via INTRAVENOUS
  Filled 2017-10-08: qty 1

## 2017-10-08 MED ORDER — LEVOFLOXACIN 750 MG PO TABS: 750.0000 mg | ORAL_TABLET | Freq: Every day | ORAL | 0 refills | Status: AC

## 2017-10-08 NOTE — ED Notes (Signed)
Pt to xray at this time.

## 2017-10-08 NOTE — ED Provider Notes (Signed)
[facility] Emergency Department Provider Note   ____________________________________________    I have reviewed the triage vital signs and the nursing notes.   HISTORY  Chief Complaint Back Pain     HPI Jeffery Huff. is a 47 y.o. male who presents with complaints of left-sided sharp upper back pain.  Patient reports the pain started yesterday morning and made it difficult for him to get out of bed because it hurts when he twists or bends.  He states the pain is moderate to severe and radiates around into his left chest as well.  He does report some pleurisy.  Denies history of DVT.  No recent travel.  No calf pain or swelling.  No fevers chills or cough.  No shortness of breath.  No hemoptysis.  No diaphoresis.  Took some Tylenol with little improvement.   Past Medical History:  Diagnosis Date  . Asthma     There are no active problems to display for this patient.   History reviewed. No pertinent surgical history.  Prior to Admission medications   Medication Sig Start Date End Date Taking? Authorizing Provider  albuterol (PROVENTIL HFA;VENTOLIN HFA) 108 (90 Base) MCG/ACT inhaler Inhale 2 puffs into the lungs every 6 (six) hours as needed for wheezing or shortness of breath. 08/02/15   [name], MD  albuterol (PROVENTIL HFA;VENTOLIN HFA) 108 (90 Base) MCG/ACT inhaler Inhale 2 puffs into the lungs every 6 (six) hours as needed for wheezing or shortness of breath. 02/21/17   Emily Filbert, MD  azithromycin (ZITHROMAX) 250 MG tablet Take 1 tablet (250 mg total) by mouth daily. 08/02/15   [name], MD  famotidine (PEPCID) 40 MG tablet Take 1 tablet (40 mg total) by mouth every evening. 06/29/17 06/29/18  Rebecka Apley, MD  HYDROcodone-acetaminophen (NORCO) 5-325 MG tablet Take 1 tablet by mouth every 6 (six) hours as needed for severe pain. 07/09/15   Hagler, Jami L, PA-C  ibuprofen (ADVIL,MOTRIN) 600 MG tablet Take 1 tablet  (600 mg total) by mouth every 6 (six) hours as needed. 07/09/15   Hagler, Jami L, PA-C  levofloxacin (LEVAQUIN) 750 MG tablet Take 1 tablet (750 mg total) by mouth daily for 7 days. 10/08/17 10/15/17  Jene Every, MD  polyvinyl alcohol (ARTIFICIAL TEARS) 1.4 % ophthalmic solution Place 1 drop into the left eye as needed for dry eyes. 08/24/17   Dionne Bucy, MD  sucralfate (CARAFATE) 1 g tablet Take 1 tablet (1 g total) by mouth 2 (two) times daily. 06/29/17   Rebecka Apley, MD     Allergies Shellfish allergy  No family history on file.  Social History Social History   Tobacco Use  . Smoking status: Current Every Day Smoker  . Smokeless tobacco: Never Used  Substance Use Topics  . Alcohol use: Yes  . Drug use: Not on file    Review of Systems  Constitutional: No fever/chills Eyes: No visual changes.  ENT: No neck pain Cardiovascular: As above Respiratory: Denies shortness of breath. Gastrointestinal: No abdominal pain.  .   Genitourinary: No difficulty urinating Musculoskeletal: Above Skin: Negative for rash. Neurological: Negative for headaches   ____________________________________________   PHYSICAL EXAM:  VITAL SIGNS: ED Triage Vitals [10/08/17 0931]  Enc Vitals Group     BP (!) 160/90     Pulse Rate (!) 122     Resp 16     Temp 99.3 F (37.4 C)     Temp Source Oral  SpO2 97 %     Weight 113.4 kg (250 lb)     Height 1.854 m (6\' 1" )     Head Circumference      Peak Flow      Pain Score 10     Pain Loc      Pain Edu?      Excl. in GC?     Constitutional: Alert and oriented.  Eyes: Conjunctivae are normal.    Mouth/Throat: Mucous membranes are moist.   Neck: Painless range of motion Cardiovascular: Tachycardia, regular rhythm. Grossly normal heart sounds.  Good peripheral circulation. Respiratory: Normal respiratory effort.  No retractions. Lungs CTAB. Gastrointestinal: Soft and nontender. No distention.   d Musculoskeletal: No back:  No vertebral tenderness to palpation, no palpable muscle spasms.  No lower extremity tenderness nor edema.  Warm and well perfused Neurologic:  Normal speech and language. No gross focal neurologic deficits are appreciated.  Skin:  Skin is warm, dry and intact. No rash noted. Psychiatric: Mood and affect are normal. Speech and behavior are normal.  ____________________________________________   LABS (all labs ordered are listed, but only abnormal results are displayed)  Labs Reviewed  BASIC METABOLIC PANEL - Abnormal; Notable for the following components:      Result Value   Glucose, Bld 129 (*)    All other components within normal limits  CBC - Abnormal; Notable for the following components:   WBC 14.2 (*)    All other components within normal limits  FIBRIN DERIVATIVES D-DIMER ([facility] ONLY) - Abnormal; Notable for the following components:   Fibrin derivatives D-dimer ([facility]) 1,147.60 (*)    All other components within normal limits  TROPONIN I   ____________________________________________  EKG  ED ECG REPORT I, Jene Everyobert Yaneth Fairbairn, the attending physician, personally viewed and interpreted this ECG.  Date: 10/08/2017  Rhythm: Tachycardia QRS Axis: normal Intervals: normal ST/T Wave abnormalities: normal Narrative Interpretation: Questionable [id number]  ____________________________________________  RADIOLOGY  Chest x-ray ____________________________________________   PROCEDURES  Procedure(s) performed: No  Procedures   Critical Care performed: No ____________________________________________   INITIAL IMPRESSION / ASSESSMENT AND PLAN / ED COURSE  Pertinent labs & imaging results that were available during my care of the patient were reviewed by me and considered in my medical decision making (see chart for details).  Patient presents with left-sided back pain which appears to be primarily musculoskeletal given pain with flexion/twisting but also has components of  pleurisy and he is tachycardic.  Will send d-dimer, labs, obtain chest x-ray and give IV Toradol  Patient with elevated d-dimer, sent for CT angiography  CT scan demonstrates likely left lower lobe pneumonia with small pleural effusion, this coupled with elevated white blood cell count is consistent with pneumonia, will treat with p.o. Levaquin, return precautions discussed    ____________________________________________   FINAL CLINICAL IMPRESSION(S) / ED DIAGNOSES  Final diagnoses:  Community acquired pneumonia of left lower lobe of lung (HCC)        Note:  This document was prepared using Dragon voice recognition software and may include unintentional dictation errors.    Jene EveryKinner, Cyle Kenyon, MD 10/08/17 1245

## 2017-10-08 NOTE — Discharge Instructions (Signed)
Follow up on thyroid mass and lung nodule as we discussed

## 2017-10-08 NOTE — ED Triage Notes (Signed)
Pt to ED via POV c/o mid back pain that started yesterday when he woke up. Pt denies any injury to the area. Pt states that he has not been lifting anything heavy. Pt states that the pain radiates into the front of his chest. Pt states that pain is worse when taking a deep breath. Pt is tachycardic at 122 in triage.

## 2019-01-30 ENCOUNTER — Other Ambulatory Visit (INDEPENDENT_AMBULATORY_CARE_PROVIDER_SITE_OTHER): Payer: Self-pay | Admitting: Nurse Practitioner

## 2019-03-09 IMAGING — CT CT ABD-PELV W/ CM
2 of 5 series · 16 of 46 positions shown, 18 images · IV contrast (APPLIED)
Comparison: CT pelvis September 17, 2008

CLINICAL DATA: Intermittent upper abdominal pain for a few months.

EXAM:
CT ABDOMEN AND PELVIS WITH CONTRAST
TECHNIQUE: Multidetector CT imaging of the abdomen and pelvis was performed
using the standard protocol following bolus administration of
intravenous contrast.
CONTRAST:  125mL 2O2BLW-[id number] IOPAMIDOL (2O2BLW-[id number]) INJECTION 61%

[Series 2: routine abd/pel with · axial · 0.81mm/px · z∈[-1094,-644]mm · 13 of 102 slices shown, 15 images]
[im 6/102  soft-tissue]
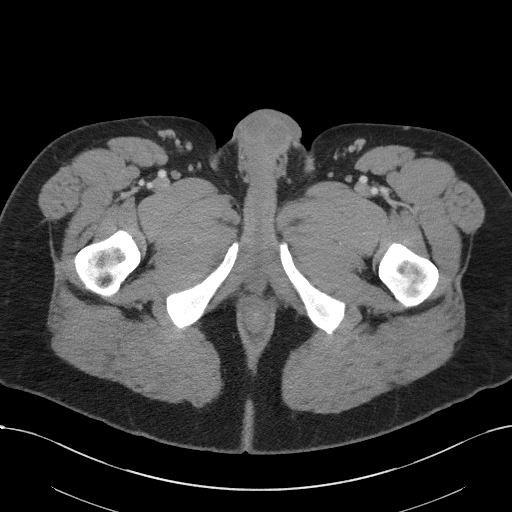
[im 6/102  bone]
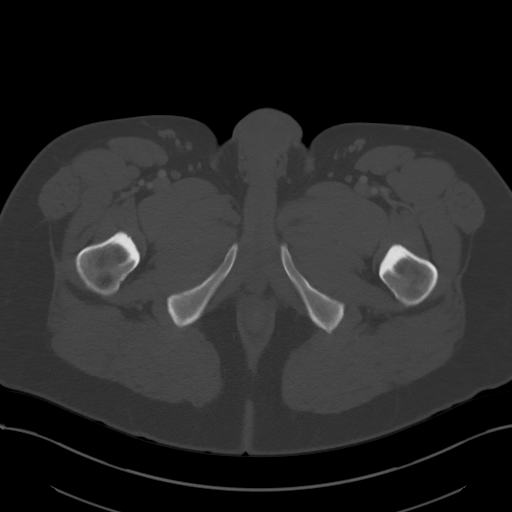
[im 16/102  soft-tissue]
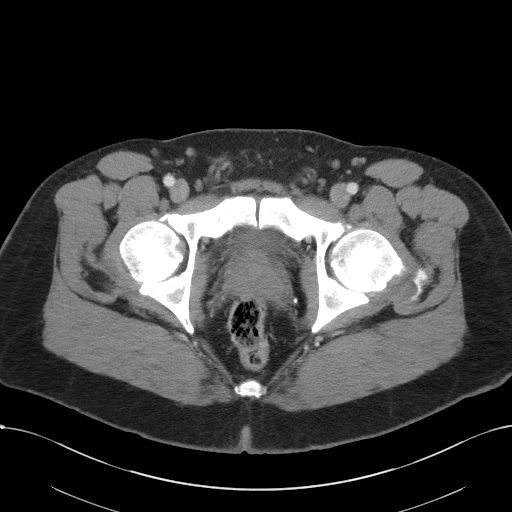
[im 22/102  soft-tissue]
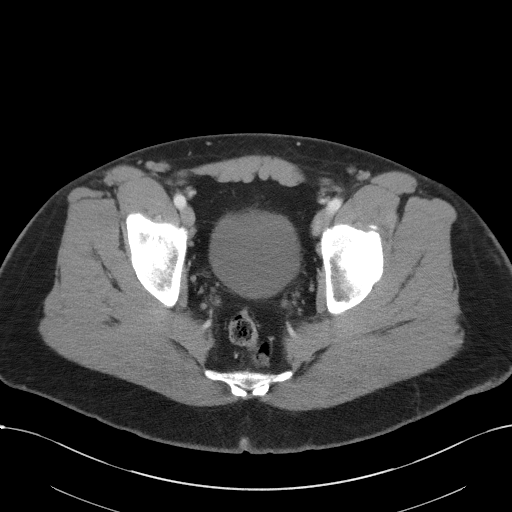
[im 27/102  soft-tissue]
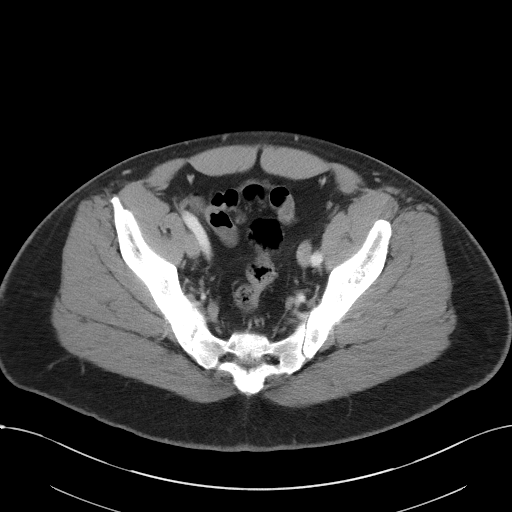
[im 38/102  soft-tissue]
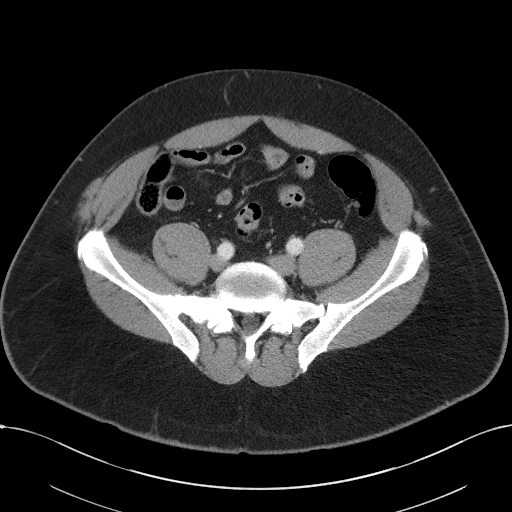
[im 43/102  soft-tissue]
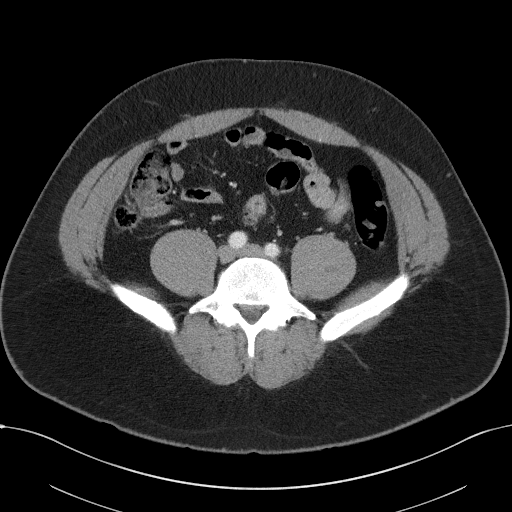
[im 54/102  soft-tissue]
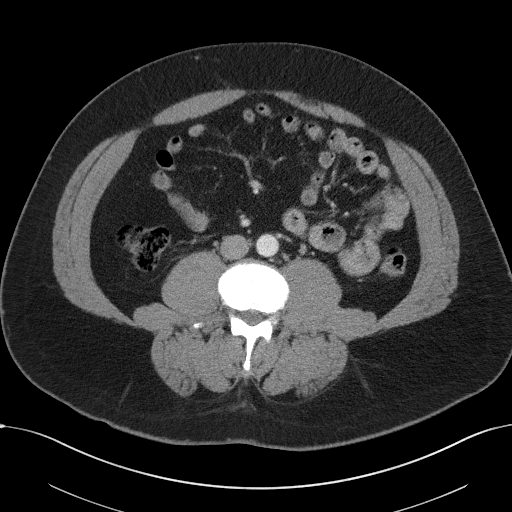
[im 59/102  soft-tissue]
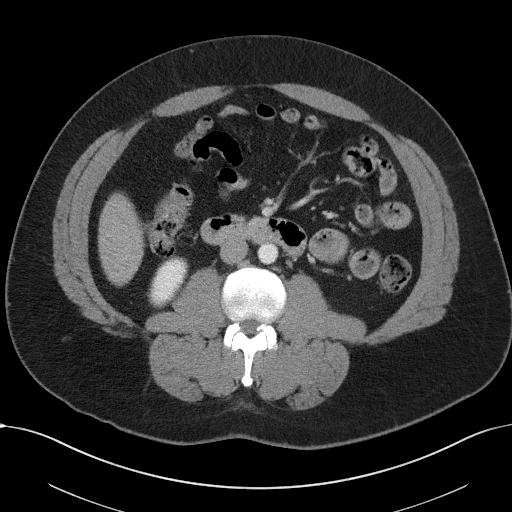
[im 64/102  soft-tissue]
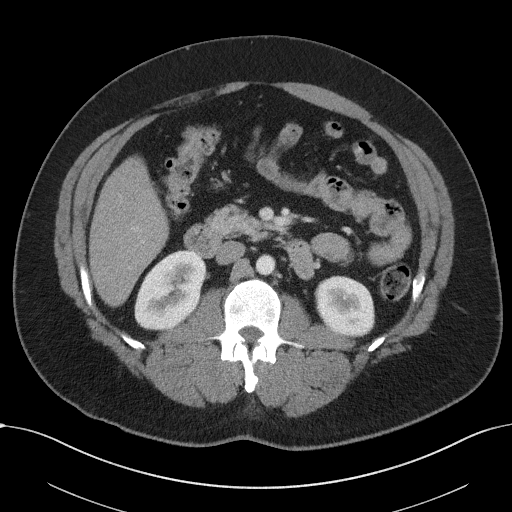
[im 64/102  bone]
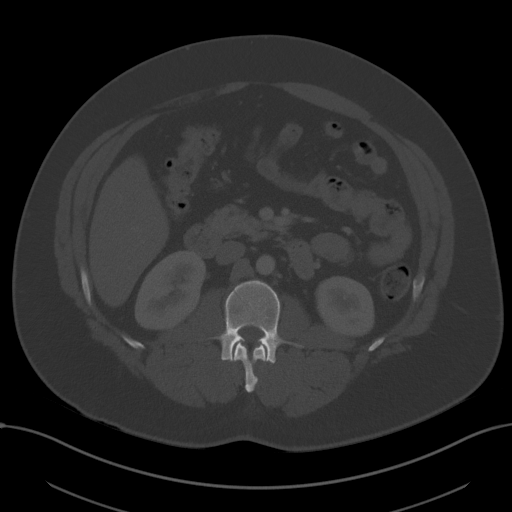
[im 75/102  soft-tissue]
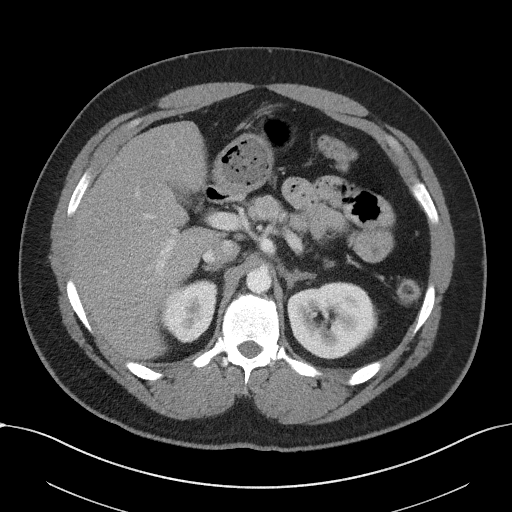
[im 80/102  soft-tissue]
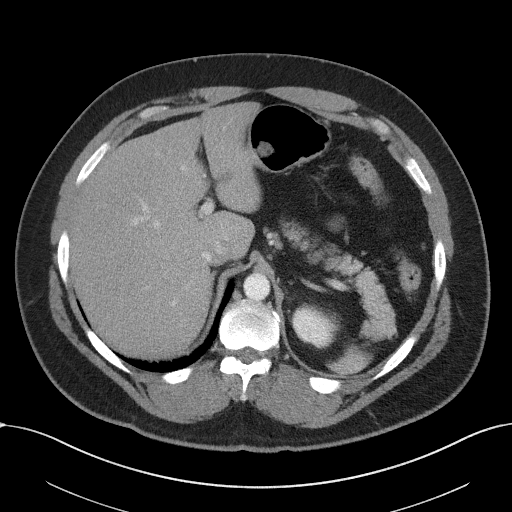
[im 86/102  soft-tissue]
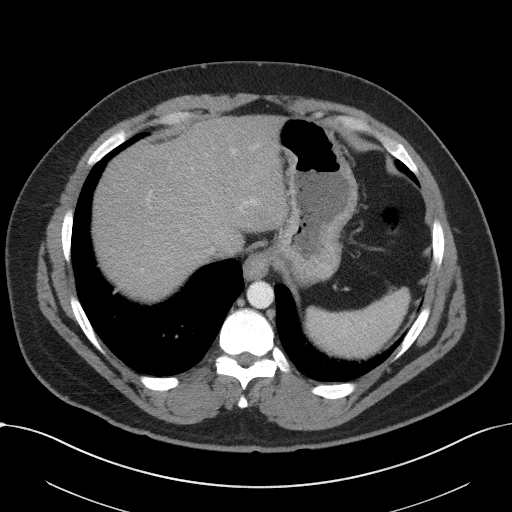
[im 96/102  soft-tissue]
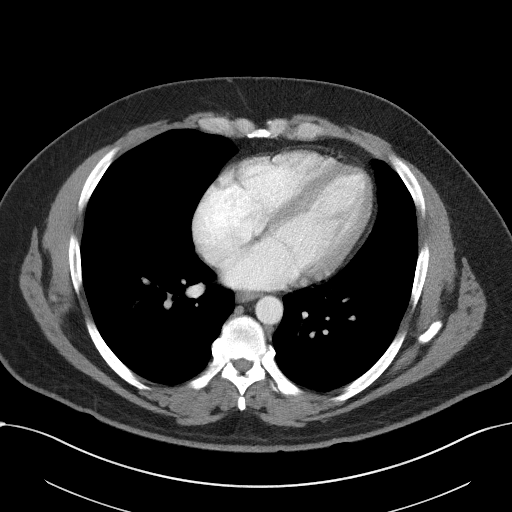

[Series 5: coronal st · coronal · 0.79mm/px · 3 of 110 slices shown]
[im 37/110  soft-tissue]
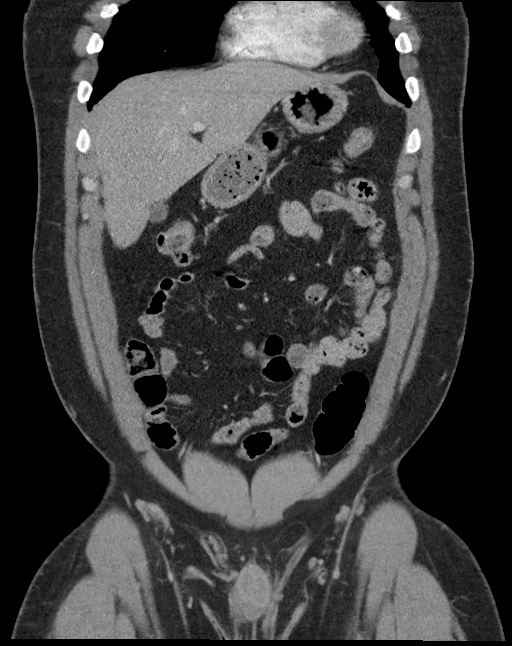
[im 49/110  soft-tissue]
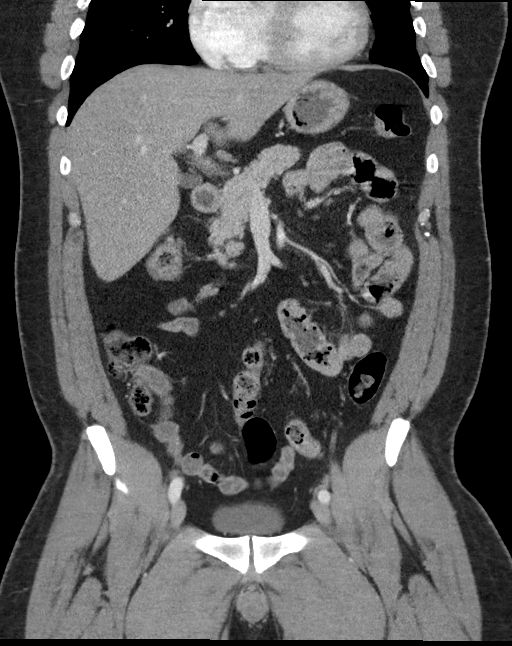
[im 61/110  soft-tissue]
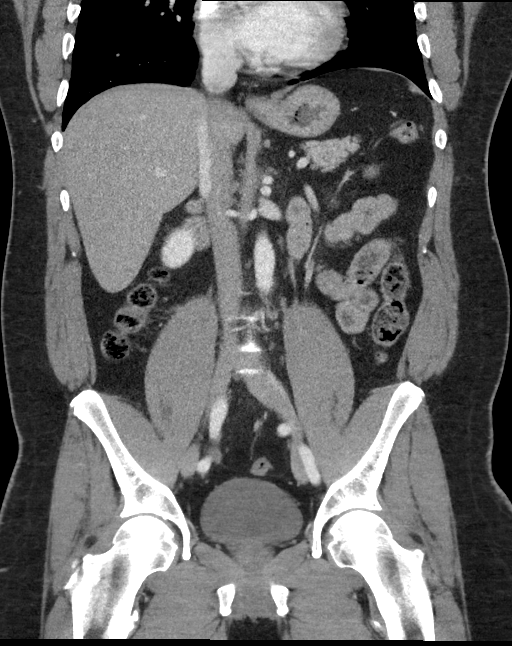

[16 of 46 positions shown; findings below may reference images not displayed]

FINDINGS: LOWER CHEST: Lung bases are clear. Included heart size is normal. No
pericardial effusion.

HEPATOBILIARY: 16 mm cyst segment for the liver. Mildly contracted
gallbladder, otherwise unremarkable.

PANCREAS: Normal.

SPLEEN: Normal.

ADRENALS/URINARY TRACT: Kidneys are orthotopic, demonstrating
symmetric enhancement. No nephrolithiasis, hydronephrosis or solid
renal masses. The unopacified ureters are normal in course and
caliber. Delayed imaging through the kidneys demonstrates symmetric
prompt contrast excretion within the proximal urinary collecting
system. Urinary bladder is partially distended and unremarkable.
Normal adrenal glands.

STOMACH/BOWEL: Very small hiatal hernia. The stomach, small and
large bowel are normal in course and caliber without inflammatory
changes. Normal appendix.

VASCULAR/LYMPHATIC: Aortoiliac vessels are normal in course and
caliber. No lymphadenopathy by CT size criteria.

REPRODUCTIVE: Normal.

OTHER: No intraperitoneal free fluid or free air.

MUSCULOSKELETAL: Nonacute. Small fat containing umbilical hernia.
Small fat containing inguinal hernias. Posttraumatic ankylosis RIGHT
sacroiliac joint.
IMPRESSION: 1. No acute intra-abdominal or pelvic process.
2. Very small hiatal hernia.

## 2019-06-18 IMAGING — CR DG CHEST 2V
1 series · 2 of 2 positions shown · non-contrast
Comparison: Radiographs February 21, 2017.

CLINICAL DATA: Chest and back pain.

EXAM:
CHEST - 2 VIEW

[Series 1: dg chest 2 view · 0.14mm/px · 2 of 2 slices shown]
[im 1/2]
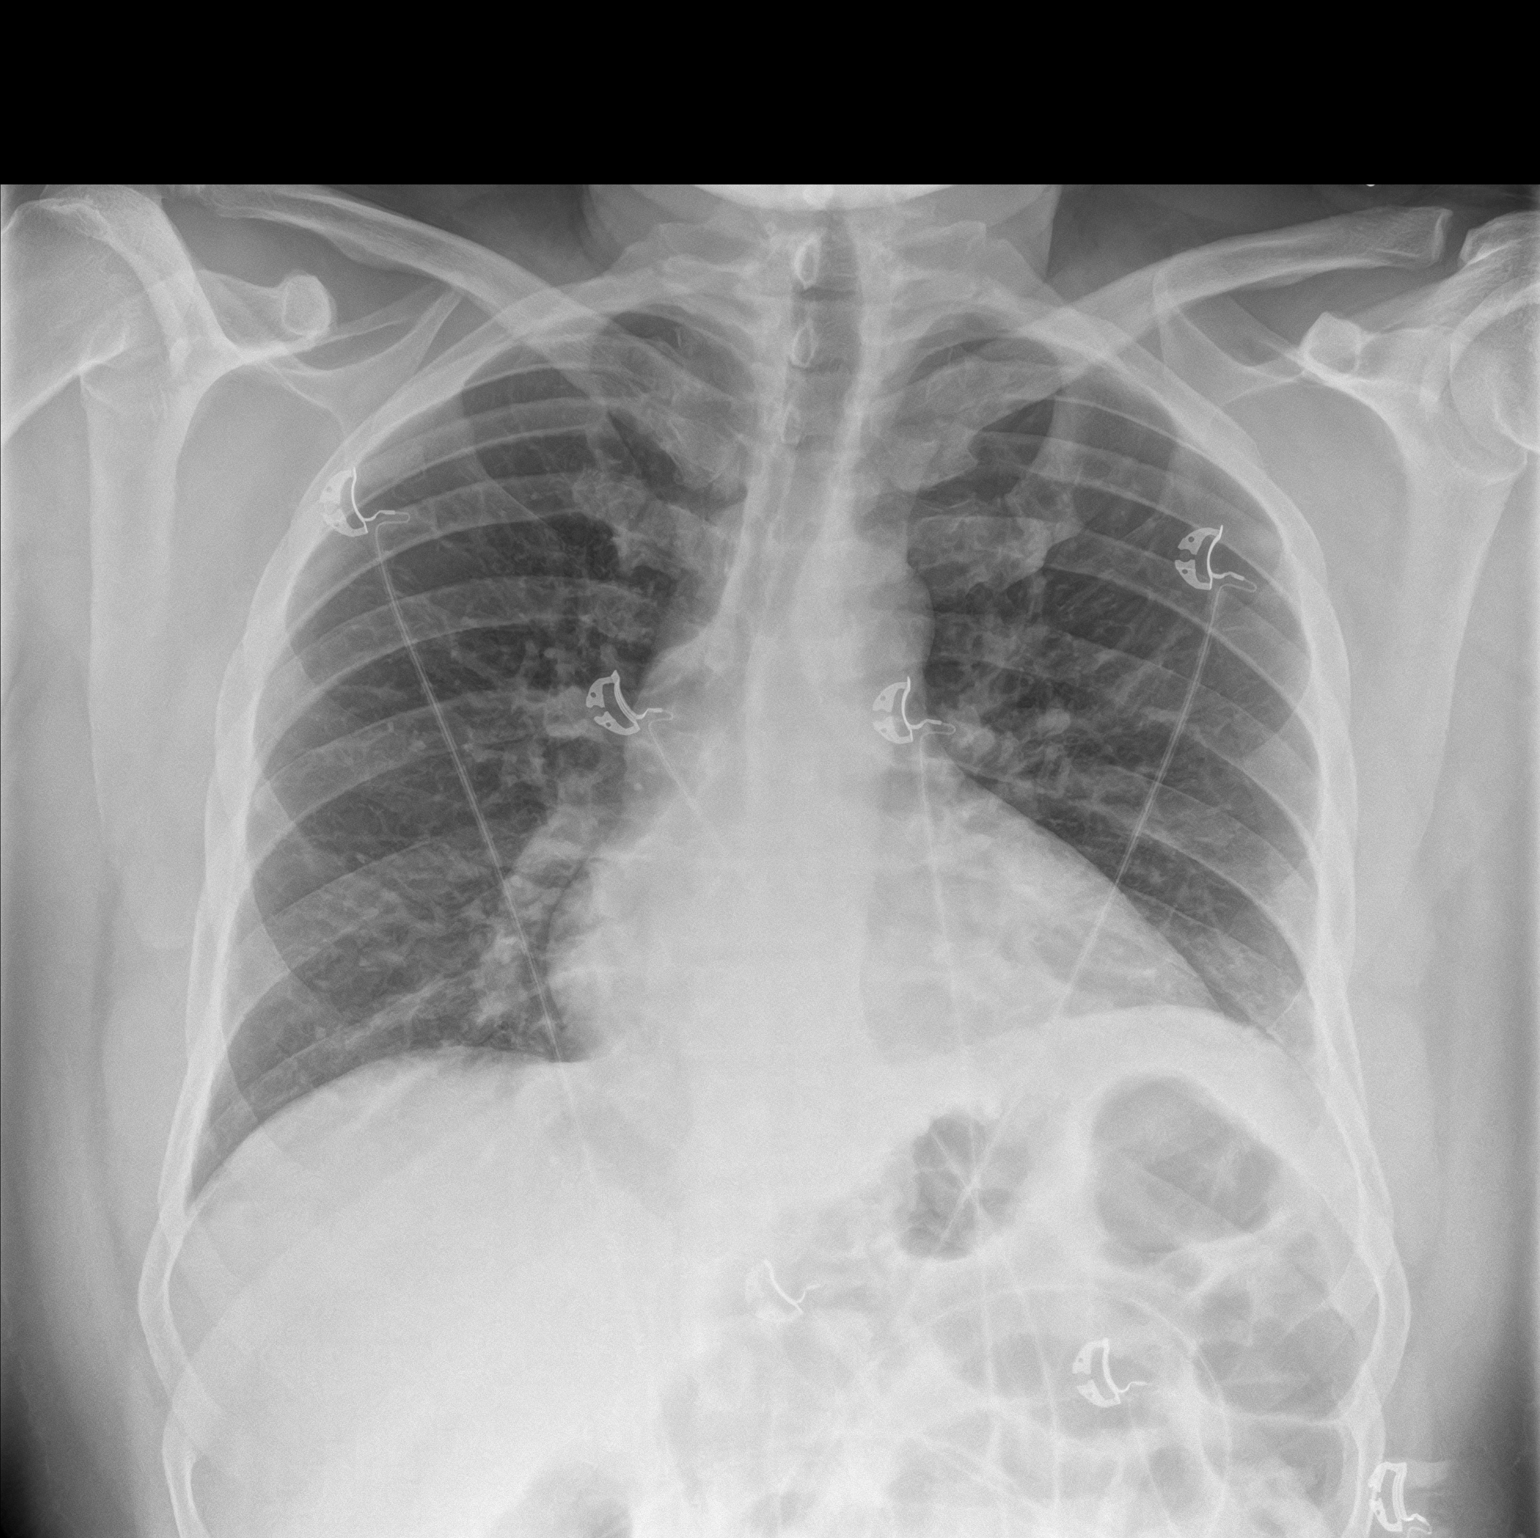
[im 2/2]
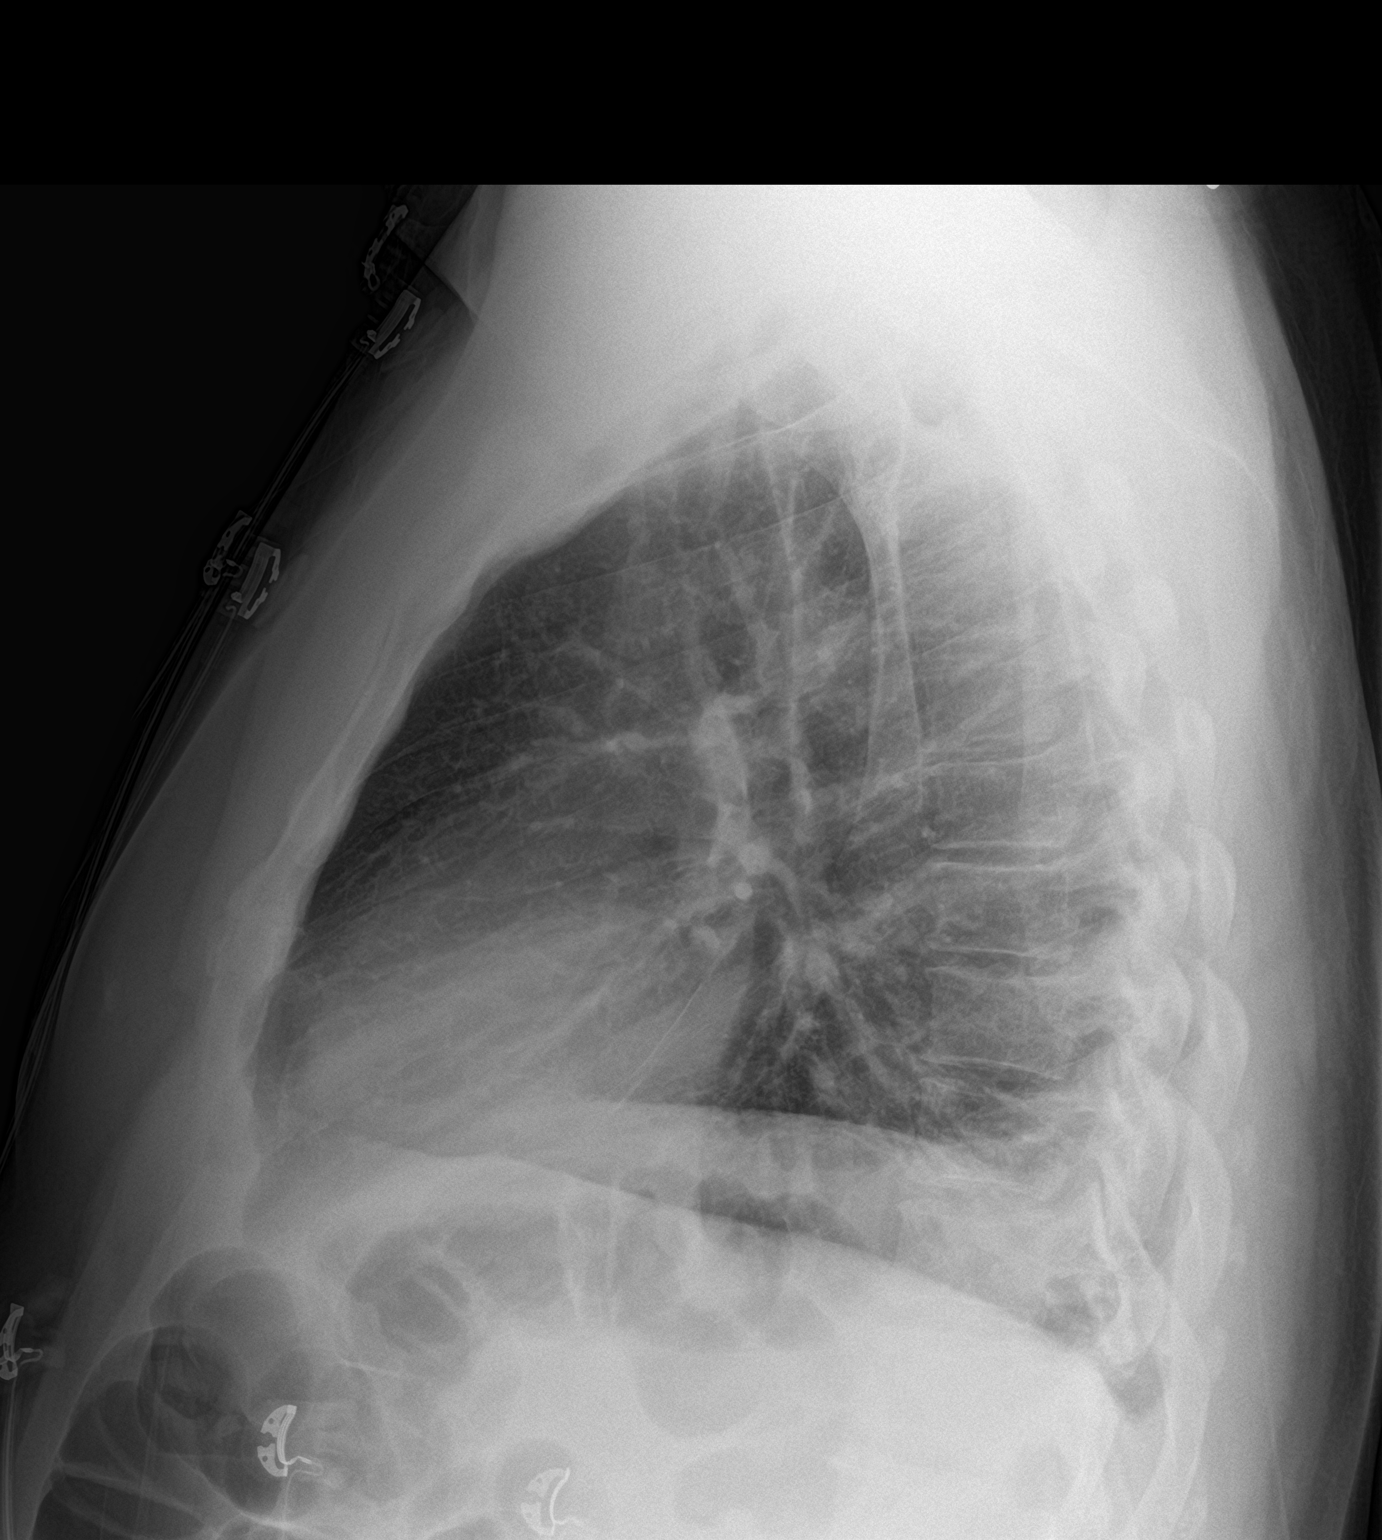

[2 of 2 positions shown; findings below may reference images not displayed]

FINDINGS: The heart size and mediastinal contours are within normal limits.
Both lungs are clear. No pneumothorax or pleural effusion is noted.
The visualized skeletal structures are unremarkable.
IMPRESSION: No active cardiopulmonary disease.

## 2020-06-06 ENCOUNTER — Ambulatory Visit: Payer: Self-pay
